# Patient Record
Sex: Female | Born: 1975 | Hispanic: No | Marital: Married | Smoking: Never smoker
Health system: Southern US, Community
[De-identification: ages and names within clinical notes are randomized; demographics above are authoritative.]

---

## 2003-10-20 ENCOUNTER — Other Ambulatory Visit: Admission: RE | Admit: 2003-10-20 | Discharge: 2003-10-20 | Payer: Self-pay | Admitting: Obstetrics and Gynecology

## 2003-10-21 ENCOUNTER — Other Ambulatory Visit: Admission: RE | Admit: 2003-10-21 | Discharge: 2003-10-21 | Payer: Self-pay | Admitting: Obstetrics and Gynecology

## 2004-05-29 ENCOUNTER — Inpatient Hospital Stay (HOSPITAL_COMMUNITY): Admission: AD | Admit: 2004-05-29 | Discharge: 2004-05-31 | Payer: Self-pay | Admitting: Obstetrics and Gynecology

## 2007-06-29 ENCOUNTER — Other Ambulatory Visit: Admission: RE | Admit: 2007-06-29 | Discharge: 2007-06-29 | Payer: Self-pay | Admitting: Family Medicine

## 2010-02-21 ENCOUNTER — Inpatient Hospital Stay (HOSPITAL_COMMUNITY): Admission: AD | Admit: 2010-02-21 | Discharge: 2010-02-22 | Payer: Self-pay | Admitting: Obstetrics and Gynecology

## 2010-02-22 ENCOUNTER — Encounter (INDEPENDENT_AMBULATORY_CARE_PROVIDER_SITE_OTHER): Payer: Self-pay | Admitting: *Deleted

## 2010-03-31 ENCOUNTER — Telehealth: Payer: Self-pay | Admitting: Internal Medicine

## 2011-01-04 NOTE — Progress Notes (Signed)
Summary: Needs sooner appt for Rectal bleeding.   Phone Note From Other Clinic Call back at [phone]   Caller: Nurse-Sarah  Summary of Call: Erica Brown Dr. Marina Goodell is DOD  Per nurse states that the patient  is having more severe rectal bleeding since child birth, she does not feel that she can wait until the first open appt time which is with Dr. Christella Hartigan on 04/26/2010 at 1:30pm. I advised the nurse that I would have a nurse call her back Patient HGB is 9.9 her labs are in [place]. Initial call taken by: Harlow Mares CMA (AAMA),  March 31, 2010 11:26 AM     Appended Document: Needs sooner appt for Rectal bleeding.  Message left for Maralyn Sago at [facility] to fax current records on pt. for Dr. review as only available info is CBCD done 02/22/10  Appended Document: Needs sooner appt for Rectal bleeding.  Huntley Dec from [facility]  office called back and pt. was given otc Anusol supp.  tx. today. No  blood work done since 02/22/10. Pt. not weak ,dizzy or short of breath.Told  I will call her back as soon as next week's PA schedule available  Appended Document: Needs sooner appt for Rectal bleeding.  work the patient and with an extender  Appended Document: Needs sooner appt for Rectal bleeding.  Dr.Silva's office ntfd. to call pt. and tell her appt. is moved up to 04/09/10 at 1:30  with PA and Dr.Jacobs is supervising dr.

## 2011-02-27 LAB — CBC
HCT: 38.3 % (ref 36.0–46.0)
MCHC: 33.9 g/dL (ref 30.0–36.0)
MCHC: 34.7 g/dL (ref 30.0–36.0)
MCV: 103.4 fL — ABNORMAL HIGH (ref 78.0–100.0)
MCV: 103.8 fL — ABNORMAL HIGH (ref 78.0–100.0)
Platelets: 121 10*3/uL — ABNORMAL LOW (ref 150–400)
Platelets: 131 10*3/uL — ABNORMAL LOW (ref 150–400)
RDW: 13.9 % (ref 11.5–15.5)
RDW: 13.9 % (ref 11.5–15.5)

## 2011-04-22 NOTE — Discharge Summary (Signed)
NAME:  Erica Brown, Erica Brown                           ACCOUNT NO.:  [id number]   MEDICAL RECORD NO.:  [id number]                   PATIENT TYPE:  INP   LOCATION:  9132                                 FACILITY:  [facility]   PHYSICIAN:  Gerrit Friends. Aldona Bar, M.D.                DATE OF BIRTH:  [birth year]   DATE OF ADMISSION:  05/29/2004  DATE OF DISCHARGE:                                 DISCHARGE SUMMARY   DISCHARGE DIAGNOSES:  1. Term pregnancy delivered 8-pound 9-ounce female infant, Apgars 5 and 9.  2. Blood type O positive.   PROCEDURES:  1. Vacuum-extraction-assisted delivery.  2. Third degree perineal tear and repair.   SUMMARY:  This 35 year old primigravida with a due date of June 01, 2004  presented in labor on June 25.  She had a pregnancy that was complicated by  fetal pyelectasis - 11 mm on the left, 4 mm on the right - and this will be  followed up by pediatric urology postpartum as previously arranged.   At the time of presentation she was contracting and dilating.  She did  undergo amniotomy on the morning of June 25 with production of mild to  moderate meconium-stained fluid.  During her pushing stage she did become  slightly febrile to 100 degrees and after pushing for 3 hours a vacuum-  assisted delivery was explained and carried out with delivery of an 8-pound  9-ounce female infant with Apgars of 5 and 9.  Cord pH was 7.22.   The patient had some post delivery bladder dysfunction requiring Foley  catheter drainage.  The Foley catheter was removed in the early morning of  June 27 and the patient subsequently voided spontaneously without  difficulty.  On the morning of June 27 she was ambulating well, tolerating a  regular diet well, having normal bowel and bladder function, was afebrile,  her breastfeeding was going well, the baby was doing well, and she was  discharged to home.  Discharge hemoglobin 10.8 with a white count of 11,500  and a platelet count of 156,000.  She was  given all appropriate instructions  on the morning of discharge as well as a prescription for Motrin 800 mg  which she will use q.8h. as needed for cramping or pain and Tylox one to two  q.4-6h. that she will use as needed for severe pain.  Her breastfeeding was  going well at the time of discharge and she will be returning to the office  for follow-up in approximately 4 weeks' time.   CONDITION ON DISCHARGE:  Improved.                                               Gerrit Friends. Aldona Bar, M.D.    RMW/MEDQ  D:  05/31/2004  T:  05/31/2004  Job:  [id number]

## 2011-08-19 ENCOUNTER — Other Ambulatory Visit: Payer: Self-pay | Admitting: Obstetrics and Gynecology

## 2011-08-19 DIAGNOSIS — N6009 Solitary cyst of unspecified breast: Secondary | ICD-10-CM

## 2011-08-26 ENCOUNTER — Ambulatory Visit
Admission: RE | Admit: 2011-08-26 | Discharge: 2011-08-26 | Disposition: A | Discharge status: Home / Self Care | Payer: BC Managed Care – PPO | Source: Ambulatory Visit | Attending: Obstetrics and Gynecology | Admitting: Obstetrics and Gynecology

## 2011-08-26 DIAGNOSIS — N6009 Solitary cyst of unspecified breast: Secondary | ICD-10-CM

## 2012-02-24 ENCOUNTER — Other Ambulatory Visit: Payer: Self-pay | Admitting: Obstetrics and Gynecology

## 2012-02-24 DIAGNOSIS — D249 Benign neoplasm of unspecified breast: Secondary | ICD-10-CM

## 2012-02-28 ENCOUNTER — Ambulatory Visit
Admission: RE | Admit: 2012-02-28 | Discharge: 2012-02-28 | Disposition: A | Discharge status: Home / Self Care | Payer: BC Managed Care – PPO | Source: Ambulatory Visit | Attending: Obstetrics and Gynecology | Admitting: Obstetrics and Gynecology

## 2012-02-28 DIAGNOSIS — D249 Benign neoplasm of unspecified breast: Secondary | ICD-10-CM

## 2017-05-20 ENCOUNTER — Emergency Department (HOSPITAL_COMMUNITY)
Admission: EM | Admit: 2017-05-20 | Discharge: 2017-05-21 | Disposition: A | Discharge status: Home / Self Care | Payer: 59 | Attending: Emergency Medicine | Admitting: Emergency Medicine

## 2017-05-20 ENCOUNTER — Emergency Department (HOSPITAL_COMMUNITY): Payer: 59

## 2017-05-20 ENCOUNTER — Encounter (HOSPITAL_COMMUNITY): Payer: Self-pay | Admitting: Emergency Medicine

## 2017-05-20 DIAGNOSIS — R109 Unspecified abdominal pain: Secondary | ICD-10-CM | POA: Insufficient documentation

## 2017-05-20 DIAGNOSIS — Z79899 Other long term (current) drug therapy: Secondary | ICD-10-CM | POA: Diagnosis not present

## 2017-05-20 DIAGNOSIS — R319 Hematuria, unspecified: Secondary | ICD-10-CM | POA: Insufficient documentation

## 2017-05-20 LAB — HCG, QUANTITATIVE, PREGNANCY: hCG, Beta Chain, Quant, S: <1 m[IU]/mL (ref <5)

## 2017-05-20 LAB — BASIC METABOLIC PANEL
Anion gap: 10 (ref 5–15)
BUN: 10 mg/dL (ref 6–20)
CHLORIDE: 102 mmol/L (ref 101–111)
CO2: 23 mmol/L (ref 22–32)
CREATININE: 0.69 mg/dL (ref 0.44–1.00)
Calcium: 9.8 mg/dL (ref 8.9–10.3)
GFR calc Af Amer: >60 mL/min (ref >60)
GLUCOSE: 126 mg/dL — AB (ref 65–99)
POTASSIUM: 3.8 mmol/L (ref 3.5–5.1)
SODIUM: 135 mmol/L (ref 135–145)

## 2017-05-20 LAB — URINALYSIS, ROUTINE W REFLEX MICROSCOPIC
BILIRUBIN URINE: NEGATIVE
Glucose, UA: NEGATIVE mg/dL
KETONES UR: NEGATIVE mg/dL
LEUKOCYTES UA: NEGATIVE
NITRITE: NEGATIVE
PROTEIN: NEGATIVE mg/dL
Specific Gravity, Urine: <1.005 — ABNORMAL LOW (ref 1.005–1.030)
pH: 6.5 (ref 5.0–8.0)

## 2017-05-20 LAB — CBC
HCT: 39 % (ref 36.0–46.0)
Hemoglobin: 13.4 g/dL (ref 12.0–15.0)
MCH: 33.2 pg (ref 26.0–34.0)
MCHC: 34.4 g/dL (ref 30.0–36.0)
MCV: 96.5 fL (ref 78.0–100.0)
Platelets: 194 K/uL (ref 150–400)
RBC: 4.04 MIL/uL (ref 3.87–5.11)
RDW: 12 % (ref 11.5–15.5)
WBC: 11.3 K/uL — ABNORMAL HIGH (ref 4.0–10.5)

## 2017-05-20 LAB — URINALYSIS, MICROSCOPIC (REFLEX)

## 2017-05-20 LAB — I-STAT BETA HCG BLOOD, ED (MC, WL, AP ONLY): I-stat hCG, quantitative: 8.7 m[IU]/mL — ABNORMAL HIGH

## 2017-05-20 NOTE — Discharge Instructions (Signed)
Ibuprofen 600 mg every 6 hours as needed for pain.  Drink plenty of fluids and get plenty of rest.  Return to the emergency department if you develop high fever, worsening pain, or other new and concerning symptoms.

## 2017-05-20 NOTE — ED Triage Notes (Signed)
Pt complaint of left flank pain with associated frequency and nausea; denies hx of kidney stone or UTI.

## 2017-05-20 NOTE — ED Provider Notes (Signed)
[facility] DEPT Provider Note   CSN: [id number] Arrival date & time: 05/20/17  1658     History   Chief Complaint Chief Complaint  Patient presents with  . Flank Pain    HPI Erica Brown is a 41 y.o. female.  Patient is a 41 year old female with no significant past medical history. She presents with a several hour history of left flank pain. This began in the absence of any injury or trauma. She has felt nauseated, but has not vomited. She does feel the urge to urinate, but denies any dysuria. She denies any abnormal periods or bleeding.   The history is provided by the patient.  Flank Pain  This is a new problem. Episode onset: This morning. Episode frequency: Intermittently. The problem has been rapidly worsening. Pertinent negatives include no chest pain and no abdominal pain. Nothing aggravates the symptoms. She has tried nothing for the symptoms.    History reviewed. No pertinent past medical history.  There are no active problems to display for this patient.   History reviewed. No pertinent surgical history.  OB History    No data available       Home Medications    Prior to Admission medications   Medication Sig Start Date End Date Taking? Authorizing Provider  calcium-vitamin D (OSCAL WITH D) 500-200 MG-UNIT tablet Take 2 tablets by mouth daily with breakfast.   Yes [provider]  ibuprofen (ADVIL,MOTRIN) 200 MG tablet Take 600 mg by mouth every 6 (six) hours as needed.   Yes [provider]  loratadine (CLARITIN) 10 MG tablet Take 10 mg by mouth daily.   Yes [provider]  omega-3 acid ethyl esters (LOVAZA) 1 g capsule Take by mouth 2 (two) times daily.   Yes [provider]  ORTHO MICRONOR 0.35 MG tablet Take 1 tablet by mouth daily. 05/11/17  Yes [provider]  Prenatal Vit-Fe Fumarate-FA (MULTIVITAMIN-PRENATAL) 27-0.8 MG TABS tablet Take 1 tablet by mouth daily at 12 noon.   Yes [provider]      Family History No family history on file.  Social History Social History  Substance Use Topics  . Smoking status: Never Smoker  . Smokeless tobacco: Not on file  . Alcohol use No     Allergies   Patient has no known allergies.   Review of Systems Review of Systems  Cardiovascular: Negative for chest pain.  Gastrointestinal: Negative for abdominal pain.  Genitourinary: Positive for flank pain.  All other systems reviewed and are negative.    Physical Exam Updated Vital Signs BP 130/70 (BP Location: Left Arm)   Pulse 71   Temp 98.3 F (36.8 C) (Oral)   Resp 18   Ht 5' 6.4" (1.687 m)   Wt 60.5 kg (133 lb 4.8 oz)   LMP 05/09/2017   SpO2 100%   BMI 21.26 kg/m   Physical Exam  Constitutional: She is oriented to person, place, and time. She appears well-developed and well-nourished. No distress.  HENT:  Head: Normocephalic and atraumatic.  Neck: Normal range of motion. Neck supple.  Cardiovascular: Normal rate and regular rhythm.  Exam reveals no gallop and no friction rub.   No murmur heard. Pulmonary/Chest: Effort normal and breath sounds normal. No respiratory distress. She has no wheezes.  Abdominal: Soft. Bowel sounds are normal. She exhibits no distension. There is no tenderness.  Musculoskeletal: Normal range of motion.  Neurological: She is alert and oriented to person, place, and time.  Skin: Skin  is warm and dry. She is not diaphoretic.  Nursing note and vitals reviewed.    ED Treatments / Results  Labs (all labs ordered are listed, but only abnormal results are displayed) Labs Reviewed  URINALYSIS, ROUTINE W REFLEX MICROSCOPIC - Abnormal; Notable for the following:       Result Value   Specific Gravity, Urine <1.005 (*)    Hgb urine dipstick MODERATE (*)    All other components within normal limits  BASIC METABOLIC PANEL - Abnormal; Notable for the following:    Glucose, Bld 126 (*)    All other components within normal limits  CBC -  Abnormal; Notable for the following:    WBC 11.3 (*)    All other components within normal limits  URINALYSIS, MICROSCOPIC (REFLEX) - Abnormal; Notable for the following:    Bacteria, UA RARE (*)    Squamous Epithelial / LPF 6-30 (*)    All other components within normal limits  I-STAT BETA HCG BLOOD, ED ([facility], [facility], [facility] ONLY) - Abnormal; Notable for the following:    I-stat hCG, quantitative 8.7 (*)    All other components within normal limits  HCG, QUANTITATIVE, PREGNANCY    EKG  EKG Interpretation None       Radiology Ct Renal Stone Study  Result Date: 05/20/2017 CLINICAL DATA:  Acute onset of left flank pain, with increased urinary frequency and nausea. Initial encounter. EXAM: CT ABDOMEN AND PELVIS WITHOUT CONTRAST TECHNIQUE: Multidetector CT imaging of the abdomen and pelvis was performed following the standard protocol without IV contrast. COMPARISON:  None. FINDINGS: Lower chest: The visualized lung bases are grossly clear. The visualized portions of the mediastinum are unremarkable. Hepatobiliary: The liver is unremarkable in appearance. The gallbladder is unremarkable in appearance. The common bile duct remains normal in caliber. Pancreas: The pancreas is within normal limits. Spleen: The spleen is unremarkable in appearance. Adrenals/Urinary Tract: The adrenal glands are unremarkable in appearance. The kidneys are within normal limits. There is no evidence of hydronephrosis. No renal or ureteral stones are identified. No perinephric stranding is seen. Stomach/Bowel: The stomach is unremarkable in appearance. The small bowel is within normal limits. The appendix is normal in caliber, best seen on coronal images, without evidence of appendicitis. The colon is unremarkable in appearance. Vascular/Lymphatic: The abdominal aorta is unremarkable in appearance. The inferior vena cava is grossly unremarkable. No retroperitoneal lymphadenopathy is seen. No pelvic sidewall lymphadenopathy is  identified. Reproductive: The bladder is moderately distended and within normal limits. The uterus is grossly unremarkable in appearance. The ovaries are relatively symmetric. No suspicious adnexal masses are seen. A small amount of free fluid within the pelvis is likely physiologic in nature. Other: No additional soft tissue abnormalities are seen. Musculoskeletal: No acute osseous abnormalities are identified. The visualized musculature is unremarkable in appearance. IMPRESSION: 1. No evidence of hydronephrosis.  No renal or ureteral stones seen. 2. Small amount of free fluid within the pelvis is likely physiologic in nature. Electronically Signed   By: Garald Balding M.D.   On: 05/20/2017 23:48    Procedures Procedures (including critical care time)  Medications Ordered in ED Medications - No data to display   Initial Impression / Assessment and Plan / ED Course  I have reviewed the triage vital signs and the nursing notes.  Pertinent labs & imaging results that were available during my care of the patient were reviewed by me and considered in my medical decision making (see chart for details).  As the patient  was attempting to give a urine specimen, she did notice a small fleck in the urine and has since felt better. I suspect that she passed a small stone. Her CT scan shows no evidence for renal calculus, however there is some blood in her urine. Laboratory studies are unremarkable. She will be discharged, to follow-up as needed for any problems.  Final Clinical Impressions(s) / ED Diagnoses   Final diagnoses:  None    New Prescriptions New Prescriptions   No medications on file     Veryl Speak, MD 05/20/17 2357

## 2020-04-01 ENCOUNTER — Encounter: Payer: Self-pay | Admitting: Emergency Medicine

## 2020-04-01 ENCOUNTER — Ambulatory Visit (INDEPENDENT_AMBULATORY_CARE_PROVIDER_SITE_OTHER): Payer: 59 | Admitting: Emergency Medicine

## 2020-04-01 ENCOUNTER — Other Ambulatory Visit: Payer: Self-pay

## 2020-04-01 ENCOUNTER — Ambulatory Visit (INDEPENDENT_AMBULATORY_CARE_PROVIDER_SITE_OTHER): Payer: 59

## 2020-04-01 VITALS — BP 92/62 | HR 74 | Temp 98.3°F | Ht 66.0 in | Wt 135.6 lb

## 2020-04-01 DIAGNOSIS — R0602 Shortness of breath: Secondary | ICD-10-CM | POA: Diagnosis not present

## 2020-04-01 DIAGNOSIS — R0600 Dyspnea, unspecified: Secondary | ICD-10-CM

## 2020-04-01 NOTE — Addendum Note (Signed)
Addended by: Tery Sanfilippo R on: 04/01/2020 10:53 AM   Modules accepted: Orders

## 2020-04-01 NOTE — Patient Instructions (Addendum)
We will perform pulmonary function testing in next office visit. Chest x-ray today Depending on the persistence of your joint pain and myalgias there may be some benefit in evaluating for other inflammatory or rheumatological disease.  We can talk more about this next time and decide whether that evaluation would be helpful. Follow with Dr. Lamonte Sakai next available with full pulmonary function testing on the same day.

## 2020-04-01 NOTE — Progress Notes (Signed)
Subjective:    Patient ID: Erica Brown, female    DOB: 05/14/1976, 44 y.o.   MRN: [id number]  HPI 44 year old never smoker, overall healthy person on minimal medications except for loratadine for allergic rhinitis.  She is referred today for evaluation of dyspnea.  She reports that she had an acute illness last February 2020 with myalgias, cough with clear watery mucous, no fevers. She was not tested for COVID at the time. The cough did finally resolve after 2 -3 weeks.she has had recurrence of her cough starting in Fall 2020, then again in Spring. Was treated with pred 50mg  x 5 days in October - made her cough better, possibly also dyspnea. Has continued to have some body aches - the pred helped this as well. Was having am aching and joint pain. She is still having some exertional SOB, has not been able to get back to her exercise routine.  She is concerned about potential COVID sequela from last February.  She does still have some symmetrical joint pain, myalgias.   ? RA eval  She has had her COVID vaccine.     Review of Systems As per HPI   History reviewed. No pertinent past medical history.   Family Hx:  No hx RA Positive for CAD, DM, HTN  Social History   Socioeconomic History  . Marital status: Married    Spouse name: Not on file  . Number of children: Not on file  . Years of education: Not on file  . Highest education level: Not on file  Occupational History  . Not on file  Tobacco Use  . Smoking status: Never Smoker  . Smokeless tobacco: Never Used  Substance and Sexual Activity  . Alcohol use: No  . Drug use: No  . Sexual activity: Not on file  Other Topics Concern  . Not on file  Social History Narrative  . Not on file   Social Determinants of Health   Financial Resource Strain:   . Difficulty of Paying Living Expenses:   Food Insecurity:   . Worried About Charity fundraiser in the Last Year:   . Arboriculturist in the Last Year:   Transportation  Needs:   . Film/video editor (Medical):   Marland Kitchen Lack of Transportation (Non-Medical):   Physical Activity:   . Days of Exercise per Week:   . Minutes of Exercise per Session:   Stress:   . Feeling of Stress :   Social Connections:   . Frequency of Communication with Friends and Family:   . Frequency of Social Gatherings with Friends and Family:   . Attends Religious Services:   . Active Member of Clubs or Organizations:   . Attends Archivist Meetings:   Marland Kitchen Marital Status:   Intimate Partner Violence:   . Fear of Current or Ex-Partner:   . Emotionally Abused:   Marland Kitchen Physically Abused:   . 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   No Known Allergies   Outpatient Medications Prior to Visit  Medication Sig Dispense Refill  . loratadine (CLARITIN) 10 MG tablet Take 10 mg by mouth daily.    Marland Kitchen omega-3 acid ethyl esters (LOVAZA) 1 g capsule Take by mouth 2 (two) times daily.    . Prenatal Vit-Fe Fumarate-FA (MULTIVITAMIN-PRENATAL) 27-0.8 MG TABS tablet Take 1 tablet by mouth daily at 12 noon.    . calcium-vitamin  D (OSCAL WITH D) 500-200 MG-UNIT tablet Take 2 tablets by mouth daily with breakfast.    . ibuprofen (ADVIL,MOTRIN) 200 MG tablet Take 600 mg by mouth every 6 (six) hours as needed.    . ORTHO MICRONOR 0.35 MG tablet Take 1 tablet by mouth daily.     No facility-administered medications prior to visit.       Objective:   Physical Exam Vitals:   04/01/20 0955  BP: 92/62  Pulse: 74  Temp: 98.3 F (36.8 C)  TempSrc: Temporal  SpO2: 98%  Weight: 135 lb 9.6 oz (61.5 kg)  Height: 5\' 6"  (1.676 m)   Gen: Pleasant, well-nourished, in no distress,  normal affect  ENT: No lesions,  mouth clear,  oropharynx clear, no postnasal drip  Neck: No JVD, no stridor  Lungs: No use of accessory muscles, no crackles or wheezing on normal respiration, no wheeze on forced expiration  Cardiovascular: RRR, heart sounds  normal, no murmur or gallops, no peripheral edema  Musculoskeletal: No deformities, no cyanosis or clubbing  Neuro: alert, awake, non focal  Skin: Warm, no lesions or rash      Assessment & Plan:  Dyspnea This followed a viral syndrome in February [id number] that certainly sounds like COVID-19.  She was not tested.  She has since had the vaccine so no real benefit in obtaining antibody testing.  She is improved but continues to have cough, dyspnea, decreased exercise capacity.  Of note she also has myalgias, joint pain, unclear whether this is part of the same syndrome.  I believe she needs pulmonary function testing, chest x-ray to evaluate for any possible sequela from suspected COVID-19 infection.  We will review next time.  It may also be beneficial to consider rheumatological referral, serologies since she has morning stiffness and pain, myalgias, etc.  We can talk about this next time.  Baltazar Apo, MD, PhD 04/01/2020, 10:37 AM [facility] [phone] or if no answer [phone]

## 2020-04-01 NOTE — Assessment & Plan Note (Signed)
This followed a viral syndrome in February [id number] that certainly sounds like COVID-19.  She was not tested.  She has since had the vaccine so no real benefit in obtaining antibody testing.  She is improved but continues to have cough, dyspnea, decreased exercise capacity.  Of note she also has myalgias, joint pain, unclear whether this is part of the same syndrome.  I believe she needs pulmonary function testing, chest x-ray to evaluate for any possible sequela from suspected COVID-19 infection.  We will review next time.  It may also be beneficial to consider rheumatological referral, serologies since she has morning stiffness and pain, myalgias, etc.  We can talk about this next time.

## 2020-05-22 ENCOUNTER — Other Ambulatory Visit (HOSPITAL_COMMUNITY)
Admission: RE | Admit: 2020-05-22 | Discharge: 2020-05-22 | Disposition: A | Discharge status: Home / Self Care | Payer: 59 | Source: Ambulatory Visit | Attending: Emergency Medicine | Admitting: Emergency Medicine

## 2020-05-22 DIAGNOSIS — Z01812 Encounter for preprocedural laboratory examination: Secondary | ICD-10-CM | POA: Diagnosis present

## 2020-05-22 DIAGNOSIS — Z20822 Contact with and (suspected) exposure to covid-19: Secondary | ICD-10-CM | POA: Diagnosis not present

## 2020-05-23 LAB — SARS CORONAVIRUS 2 (TAT 6-24 HRS): SARS Coronavirus 2: NEGATIVE

## 2020-05-23 NOTE — Progress Notes (Signed)
Per the lab, the covid results should be resulted by 12:00pm today. 

## 2020-05-25 ENCOUNTER — Ambulatory Visit (INDEPENDENT_AMBULATORY_CARE_PROVIDER_SITE_OTHER): Payer: 59 | Admitting: Emergency Medicine

## 2020-05-25 ENCOUNTER — Other Ambulatory Visit: Payer: Self-pay

## 2020-05-25 ENCOUNTER — Encounter: Payer: Self-pay | Admitting: Emergency Medicine

## 2020-05-25 DIAGNOSIS — R0602 Shortness of breath: Secondary | ICD-10-CM | POA: Diagnosis not present

## 2020-05-25 DIAGNOSIS — R0600 Dyspnea, unspecified: Secondary | ICD-10-CM | POA: Diagnosis not present

## 2020-05-25 LAB — PULMONARY FUNCTION TEST
DL/VA % pred: 80 %
DL/VA: 4.12 ml/min/mmHg/L
DLCO cor % pred: 67 %
DLCO cor: 19 ml/min/mmHg
DLCO unc % pred: 67 %
DLCO unc: 19 ml/min/mmHg
FEF 25-75 Post: 3.8 L/sec
FEF 25-75 Pre: 3.09 L/sec
FEF2575-%Change-Post: 23 %
FEF2575-%Pred-Post: 121 %
FEF2575-%Pred-Pre: 98 %
FEV1-%Change-Post: 3 %
FEV1-%Pred-Post: 103 %
FEV1-%Pred-Pre: 99 %
FEV1-Post: 3.15 L
FEV1-Pre: 3.04 L
FEV1FVC-%Change-Post: 3 %
FEV1FVC-%Pred-Pre: 100 %
FEV6-%Change-Post: 0 %
FEV6-%Pred-Post: 91 %
FEV6-%Pred-Pre: 91 %
FEV6-Post: 3.61 L
FEV6-Pre: 3.63 L
FEV6FVC-%Change-Post: 0 %
FEV6FVC-%Pred-Post: 93 %
FEV6FVC-%Pred-Pre: 93 %
FVC-%Change-Post: 0 %
FVC-%Pred-Post: 98 %
FVC-%Pred-Pre: 98 %
FVC-Post: 3.68 L
FVC-Pre: 3.67 L
Post FEV1/FVC ratio: 86 %
Post FEV6/FVC ratio: 100 %
Pre FEV1/FVC ratio: 83 %
Pre FEV6/FVC Ratio: 99 %
RV % pred: 118 %
RV: 2.1 L
TLC % pred: 98 %
TLC: 5.45 L

## 2020-05-25 NOTE — Progress Notes (Signed)
PFT done today. 

## 2020-05-25 NOTE — Progress Notes (Signed)
Subjective:    Patient ID: Erica Brown, female    DOB: October 13, 1976, 44 y.o.   MRN: [id number]  HPI 44 year old never smoker, overall healthy person on minimal medications except for loratadine for allergic rhinitis.  She is referred today for evaluation of dyspnea.  She reports that she had an acute illness last February 2020 with myalgias, cough with clear watery mucous, no fevers. She was not tested for COVID at the time. The cough did finally resolve after 2 -3 weeks.she has had recurrence of her cough starting in Fall 2020, then again in Spring. Was treated with pred 50mg  x 5 days in October - made her cough better, possibly also dyspnea. Has continued to have some body aches - the pred helped this as well. Was having am aching and joint pain. She is still having some exertional SOB, has not been able to get back to her exercise routine.  She is concerned about potential COVID sequela from last February.  She does still have some symmetrical joint pain, myalgias.   She has had her COVID vaccine.   ROV 05/25/20 --follow-up visit for 44 year old never smoker under evaluation for dyspnea.  She may have had COVID-19 in 01/2019, was not tested at the time.  She continued to have some exertional shortness of breath, did not return to her baseline continues to cough as well.  She is some joint pain and myalgias, question rheumatological >> this is better than last time, less bothersome.   Chest x-ray 04/01/2020 reviewed by me, shows no evidence of ILD or infiltrate Pulmonary function testing performed today 05/25/2020 reviewed by me, showed normal airflows, normal lung volumes, slightly decreased diffusion capacity that corrects to the normal range when adjusted for alveolar volume.  Her flow volume loops are normal.     Review of Systems As per HPI   History reviewed. No pertinent past medical history.   Family Hx:  No hx RA Positive for CAD, DM, HTN  Social History   Socioeconomic  History  . Marital status: Married    Spouse name: Not on file  . Number of children: Not on file  . Years of education: Not on file  . Highest education level: Not on file  Occupational History  . Not on file  Tobacco Use  . Smoking status: Never Smoker  . Smokeless tobacco: Never Used  Substance and Sexual Activity  . Alcohol use: No  . Drug use: No  . Sexual activity: Not on file  Other Topics Concern  . Not on file  Social History Narrative  . Not on file   Social Determinants of Health   Financial Resource Strain:   . Difficulty of Paying Living Expenses:   Food Insecurity:   . Worried About Charity fundraiser in the Last Year:   . Arboriculturist in the Last Year:   Transportation Needs:   . Film/video editor (Medical):   Marland Kitchen Lack of Transportation (Non-Medical):   Physical Activity:   . Days of Exercise per Week:   . Minutes of Exercise per Session:   Stress:   . Feeling of Stress :   Social Connections:   . Frequency of Communication with Friends and Family:   . Frequency of Social Gatherings with Friends and Family:   . Attends Religious Services:   . Active Member of Clubs or Organizations:   . Attends Archivist Meetings:   Marland Kitchen Marital Status:   Intimate Partner Violence:   .  Fear of Current or Ex-Partner:   . Emotionally Abused:   Marland Kitchen Physically Abused:   . 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   No Known Allergies   Outpatient Medications Prior to Visit  Medication Sig Dispense Refill  . loratadine (CLARITIN) 10 MG tablet Take 10 mg by mouth daily.    . OMEGA-3-ACID ETH EST, DIETARY, PO Take by mouth.    . Prenatal Vit-Fe Fumarate-FA (MULTIVITAMIN-PRENATAL) 27-0.8 MG TABS tablet Take 1 tablet by mouth daily at 12 noon.    Marland Kitchen omega-3 acid ethyl esters (LOVAZA) 1 g capsule Take by mouth 2 (two) times daily. (Patient not taking: Reported on 05/25/2020)     No facility-administered  medications prior to visit.       Objective:   Physical Exam Vitals:   05/25/20 0959  BP: 104/68  Pulse: 64  Temp: 98.2 F (36.8 C)  TempSrc: Oral  SpO2: 98%  Weight: 134 lb (60.8 kg)  Height: 5\' 7"  (1.702 m)   Gen: Pleasant, well-nourished, in no distress,  normal affect  ENT: No lesions,  mouth clear,  oropharynx clear, no postnasal drip  Neck: No JVD, no stridor  Lungs: No use of accessory muscles, no crackles or wheezing on normal respiration, no wheeze on forced expiration  Cardiovascular: RRR, heart sounds normal, no murmur or gallops, no peripheral edema  Musculoskeletal: No deformities, no cyanosis or clubbing  Neuro: alert, awake, non focal  Skin: Warm, no lesions or rash      Assessment & Plan:  No problem-specific Assessment & Plan notes found for this encounter.  Baltazar Apo, MD, PhD 05/25/2020, 10:18 AM [facility] [phone] or if no answer [phone]

## 2020-05-25 NOTE — Assessment & Plan Note (Signed)
Improved.  Her myalgias and arthralgias are improved as well.  Still may not be back to her previous cardiopulmonary conditioning, did notice some dyspnea when bike riding but she is just getting back started with this exercise routine.  Pulmonary function testing and chest x-ray are reassuring.  I do not see any evidence for post viral sequela.  Reassured her.  She does not need to follow-up unless she develops a clinical change.  Your pulmonary function testing and chest x-ray are both normal.  This is good news and suggests that there is no residual lung injury following possible COVID-19 infection in 2020. Okay to continue and build on your cardiopulmonary conditioning, bike riding, exercise. Please follow-up if you have any changes in your breathing or respiratory symptoms.

## 2020-05-25 NOTE — Patient Instructions (Signed)
Your pulmonary function testing and chest x-ray are both normal.  This is good news and suggests that there is no residual lung injury following possible COVID-19 infection in 2020. Okay to continue and build on your cardiopulmonary conditioning, bike riding, exercise. Please follow-up if you have any changes in your breathing or respiratory symptoms.

## 2020-07-13 ENCOUNTER — Ambulatory Visit: Payer: 59 | Admitting: Emergency Medicine

## 2020-09-05 IMAGING — DX DG CHEST 2V
2 series · 2 of 2 positions shown · non-contrast
Comparison: None.

CLINICAL DATA: Dyspnea.

EXAM:
CHEST - 2 VIEW

[chest pa]
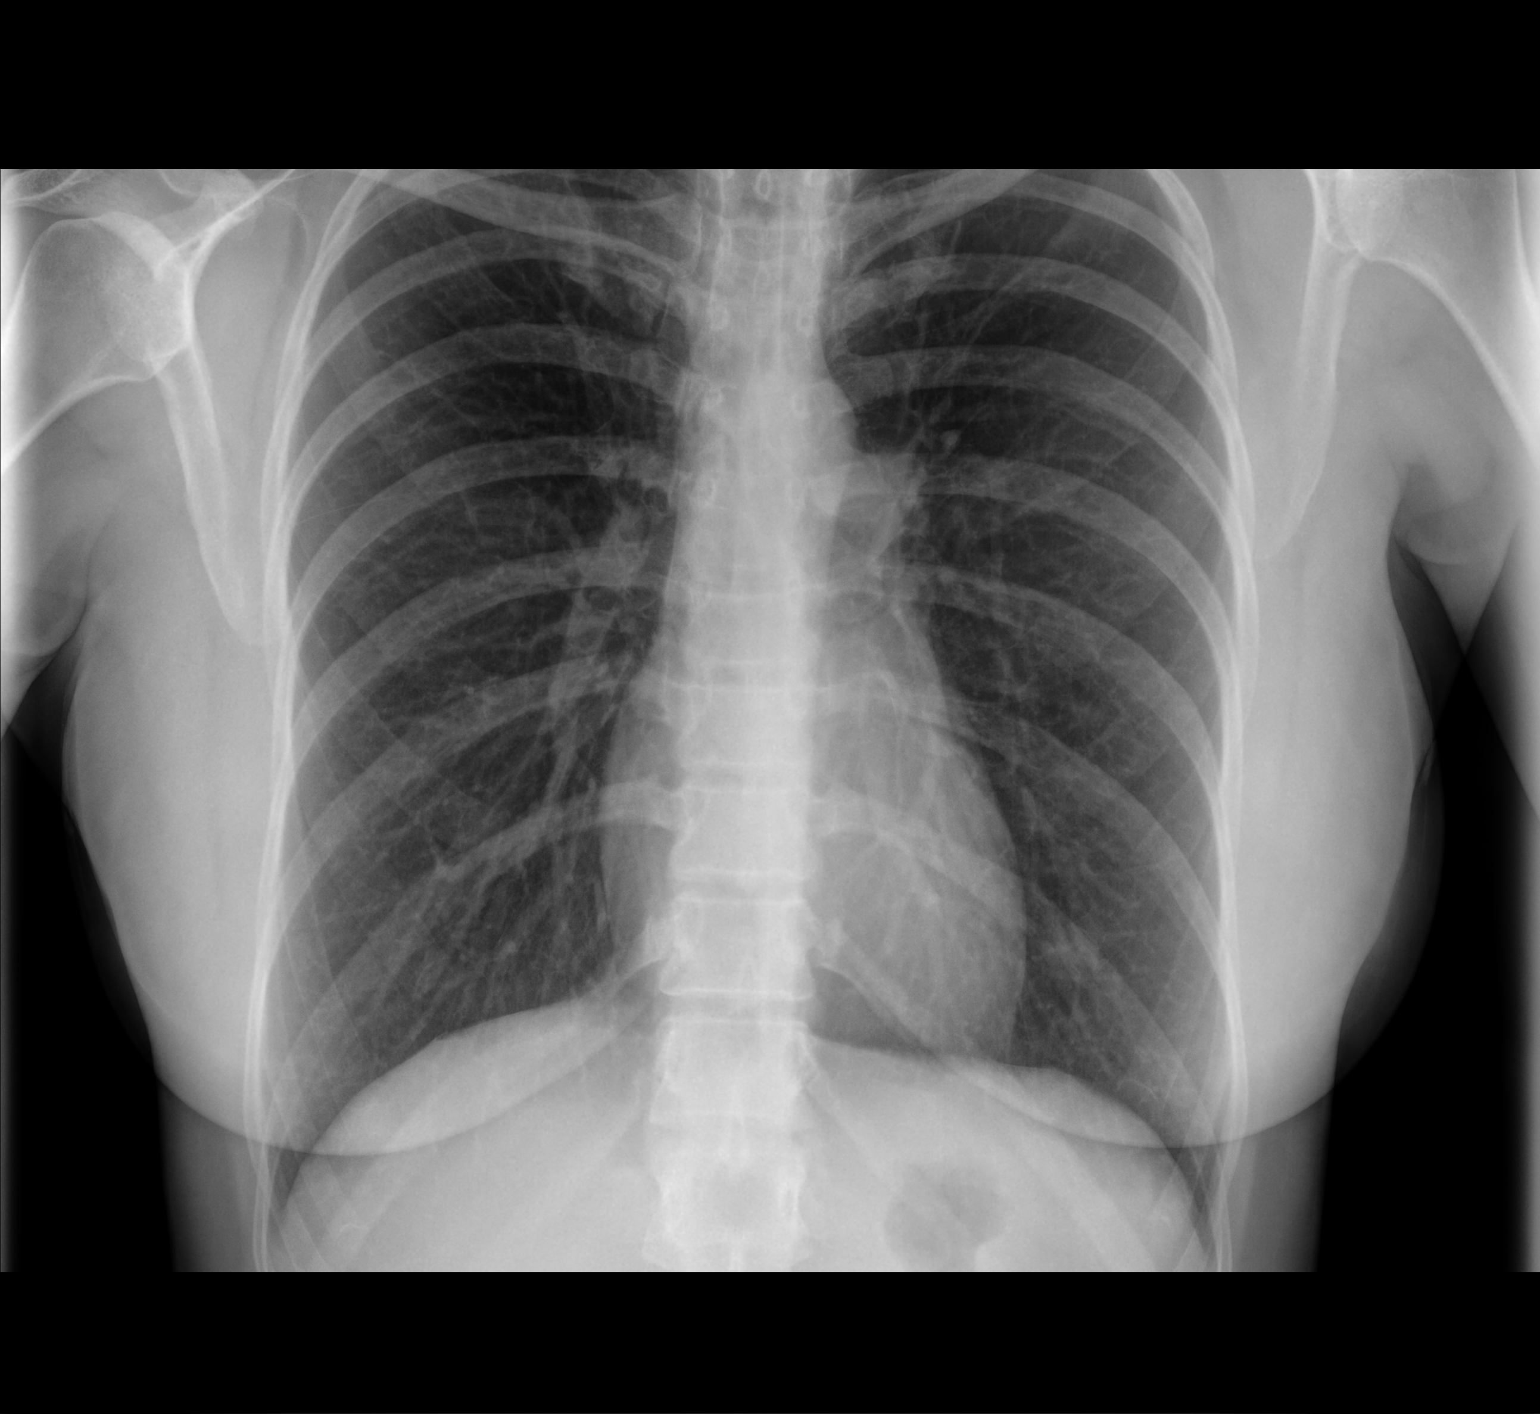

[chest lat]
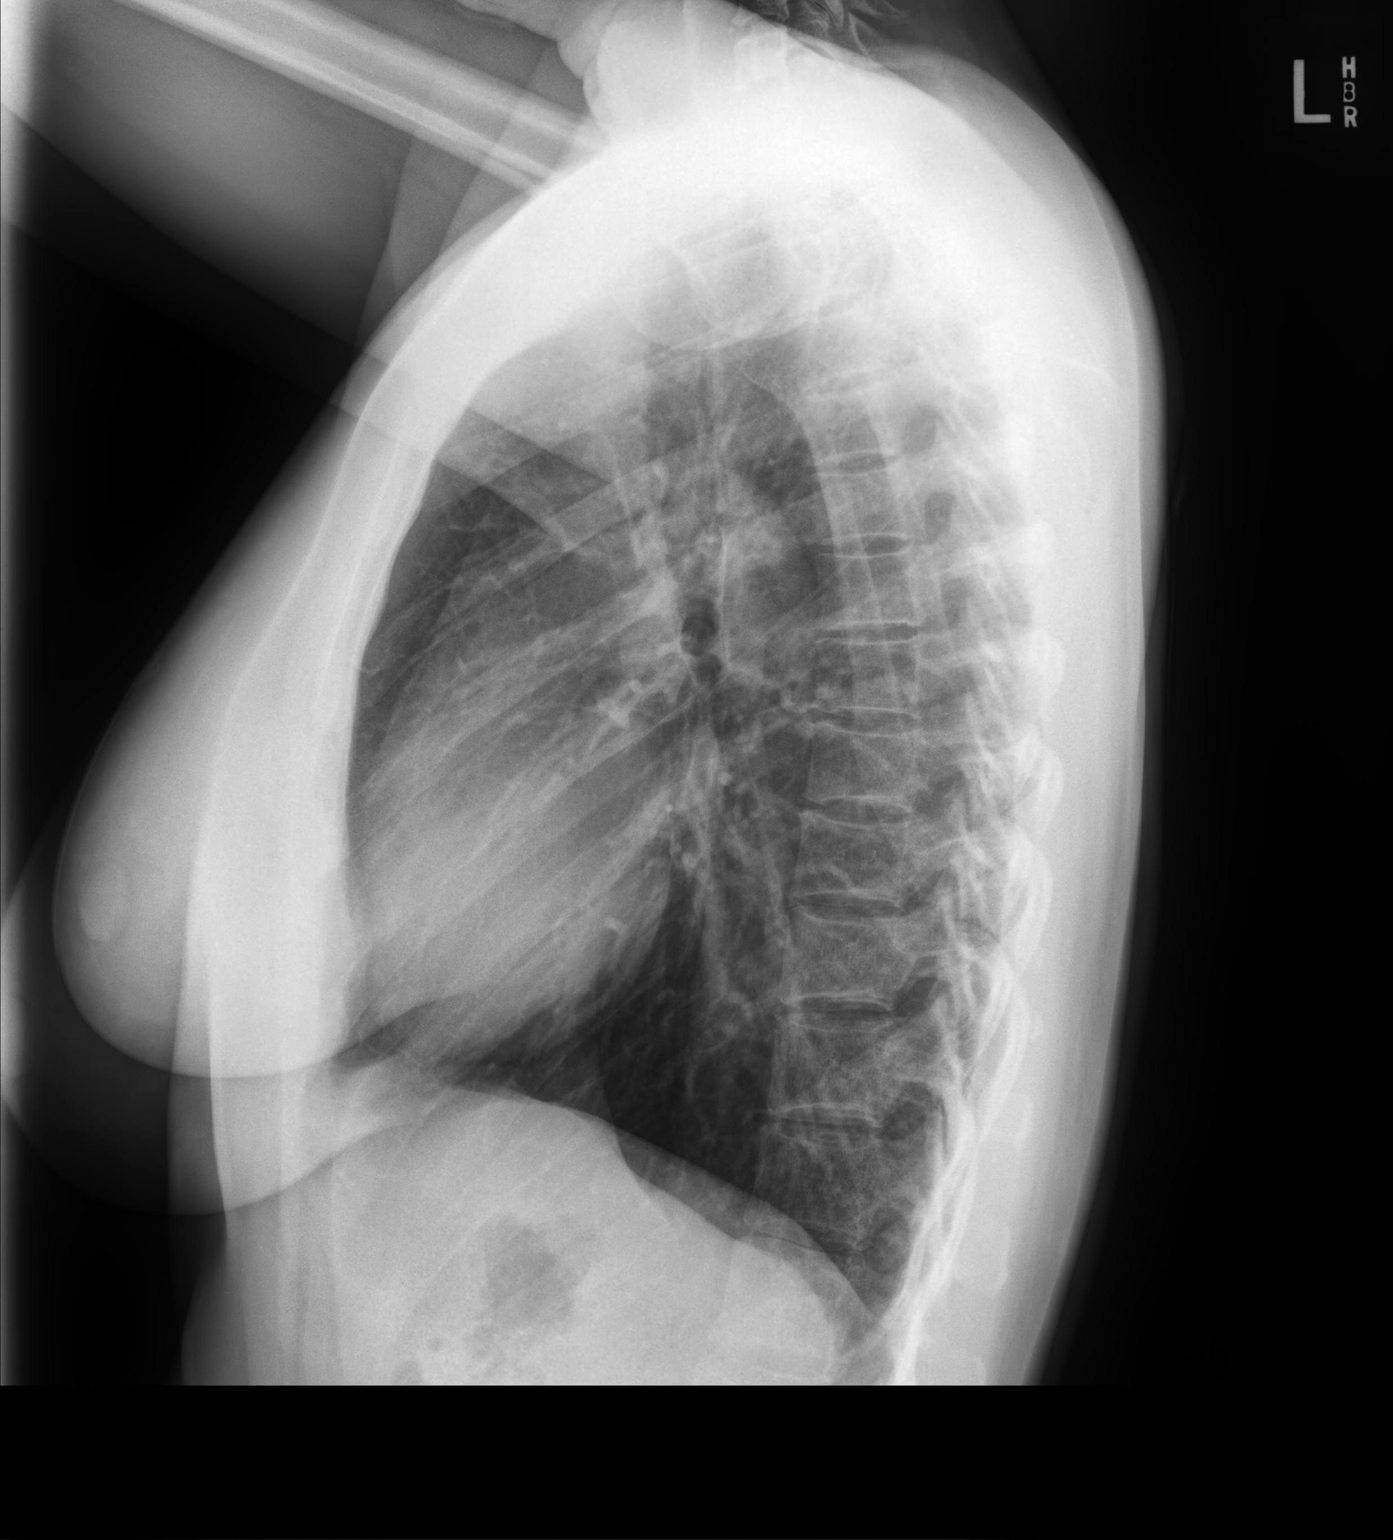

[2 of 2 positions shown; findings below may reference images not displayed]

FINDINGS: The heart size and mediastinal contours are within normal limits.
Both lungs are clear. No pneumothorax or pleural effusion is noted.
The visualized skeletal structures are unremarkable.
IMPRESSION: No active cardiopulmonary disease.

## 2022-09-02 ENCOUNTER — Other Ambulatory Visit: Payer: Self-pay | Admitting: Family Medicine

## 2022-09-02 ENCOUNTER — Other Ambulatory Visit (HOSPITAL_COMMUNITY)
Admission: RE | Admit: 2022-09-02 | Discharge: 2022-09-02 | Disposition: A | Discharge status: Home / Self Care | Payer: 59 | Source: Ambulatory Visit | Attending: Family Medicine | Admitting: Family Medicine

## 2022-09-02 DIAGNOSIS — Z01411 Encounter for gynecological examination (general) (routine) with abnormal findings: Secondary | ICD-10-CM | POA: Insufficient documentation

## 2022-09-06 LAB — CYTOLOGY - PAP
Adequacy: ABSENT
Comment: NEGATIVE
Diagnosis: NEGATIVE
High risk HPV: NEGATIVE

## 2023-09-12 DIAGNOSIS — E7800 Pure hypercholesterolemia, unspecified: Secondary | ICD-10-CM | POA: Diagnosis not present

## 2023-09-12 DIAGNOSIS — Z0000 Encounter for general adult medical examination without abnormal findings: Secondary | ICD-10-CM | POA: Diagnosis not present

## 2023-09-12 DIAGNOSIS — R7303 Prediabetes: Secondary | ICD-10-CM | POA: Diagnosis not present

## 2023-09-20 DIAGNOSIS — Z1211 Encounter for screening for malignant neoplasm of colon: Secondary | ICD-10-CM | POA: Diagnosis not present

## 2024-09-16 DIAGNOSIS — L659 Nonscarring hair loss, unspecified: Secondary | ICD-10-CM | POA: Diagnosis not present

## 2024-09-16 DIAGNOSIS — R7303 Prediabetes: Secondary | ICD-10-CM | POA: Diagnosis not present

## 2024-09-16 DIAGNOSIS — Z0000 Encounter for general adult medical examination without abnormal findings: Secondary | ICD-10-CM | POA: Diagnosis not present

## 2024-09-16 DIAGNOSIS — E7800 Pure hypercholesterolemia, unspecified: Secondary | ICD-10-CM | POA: Diagnosis not present

## 2024-09-16 DIAGNOSIS — Z23 Encounter for immunization: Secondary | ICD-10-CM | POA: Diagnosis not present

## 2024-10-11 DIAGNOSIS — N920 Excessive and frequent menstruation with regular cycle: Secondary | ICD-10-CM | POA: Diagnosis not present

## 2024-10-15 DIAGNOSIS — N920 Excessive and frequent menstruation with regular cycle: Secondary | ICD-10-CM | POA: Diagnosis not present

## 2024-10-16 DIAGNOSIS — Z3202 Encounter for pregnancy test, result negative: Secondary | ICD-10-CM | POA: Diagnosis not present

## 2024-10-16 DIAGNOSIS — N939 Abnormal uterine and vaginal bleeding, unspecified: Secondary | ICD-10-CM | POA: Diagnosis not present

## 2024-11-18 ENCOUNTER — Other Ambulatory Visit: Payer: Self-pay | Admitting: Nurse Practitioner

## 2024-11-18 DIAGNOSIS — N841 Polyp of cervix uteri: Secondary | ICD-10-CM

## 2024-11-18 DIAGNOSIS — D649 Anemia, unspecified: Secondary | ICD-10-CM | POA: Diagnosis not present

## 2024-11-18 DIAGNOSIS — N920 Excessive and frequent menstruation with regular cycle: Secondary | ICD-10-CM

## 2024-12-20 ENCOUNTER — Ambulatory Visit: Admission: RE | Admit: 2024-12-20 | Payer: Self-pay | Source: Ambulatory Visit

## 2024-12-20 ENCOUNTER — Other Ambulatory Visit: Payer: Self-pay | Admitting: Nurse Practitioner

## 2024-12-20 DIAGNOSIS — N920 Excessive and frequent menstruation with regular cycle: Secondary | ICD-10-CM

## 2024-12-20 DIAGNOSIS — N841 Polyp of cervix uteri: Secondary | ICD-10-CM
